# Patient Record
Sex: Female | Born: 1985 | Race: White | Hispanic: No | Smoking: Never smoker
Health system: Southern US, Community
[De-identification: ages and names within clinical notes are randomized; demographics above are authoritative.]

## PROBLEM LIST (undated history)

## (undated) ENCOUNTER — Inpatient Hospital Stay: Payer: Self-pay

## (undated) DIAGNOSIS — Z98891 History of uterine scar from previous surgery: Secondary | ICD-10-CM

## (undated) DIAGNOSIS — E669 Obesity, unspecified: Secondary | ICD-10-CM

## (undated) DIAGNOSIS — Z8759 Personal history of other complications of pregnancy, childbirth and the puerperium: Secondary | ICD-10-CM

## (undated) HISTORY — DX: History of uterine scar from previous surgery: Z98.891

## (undated) HISTORY — DX: Personal history of other complications of pregnancy, childbirth and the puerperium: Z87.59

## (undated) HISTORY — PX: TUBAL LIGATION: SHX77

## (undated) HISTORY — DX: Obesity, unspecified: E66.9

---

## 2015-02-15 ENCOUNTER — Other Ambulatory Visit: Payer: Self-pay | Admitting: Obstetrics and Gynecology

## 2015-02-15 DIAGNOSIS — Z3493 Encounter for supervision of normal pregnancy, unspecified, third trimester: Secondary | ICD-10-CM | POA: Insufficient documentation

## 2015-02-15 DIAGNOSIS — IMO0002 Reserved for concepts with insufficient information to code with codable children: Secondary | ICD-10-CM | POA: Insufficient documentation

## 2015-02-15 DIAGNOSIS — Z3491 Encounter for supervision of normal pregnancy, unspecified, first trimester: Secondary | ICD-10-CM

## 2015-02-15 LAB — HM PAP SMEAR: HM PAP: NEGATIVE

## 2015-03-07 ENCOUNTER — Ambulatory Visit: Payer: Medicaid Other

## 2015-03-07 ENCOUNTER — Ambulatory Visit: Payer: Self-pay

## 2015-03-14 ENCOUNTER — Ambulatory Visit
Admission: RE | Admit: 2015-03-14 | Discharge: 2015-03-14 | Disposition: A | Discharge status: Home / Self Care | Payer: Medicaid Other | Source: Ambulatory Visit | Attending: Maternal & Fetal Medicine | Admitting: Maternal & Fetal Medicine

## 2015-03-14 ENCOUNTER — Ambulatory Visit (HOSPITAL_BASED_OUTPATIENT_CLINIC_OR_DEPARTMENT_OTHER)
Admission: RE | Admit: 2015-03-14 | Discharge: 2015-03-14 | Disposition: A | Discharge status: Home / Self Care | Payer: Medicaid Other | Source: Ambulatory Visit | Attending: Obstetrics and Gynecology | Admitting: Obstetrics and Gynecology

## 2015-03-14 DIAGNOSIS — E669 Obesity, unspecified: Secondary | ICD-10-CM

## 2015-03-14 DIAGNOSIS — O99211 Obesity complicating pregnancy, first trimester: Secondary | ICD-10-CM | POA: Insufficient documentation

## 2015-03-14 DIAGNOSIS — Z3A10 10 weeks gestation of pregnancy: Secondary | ICD-10-CM | POA: Diagnosis not present

## 2015-03-14 DIAGNOSIS — Z36 Encounter for antenatal screening of mother: Secondary | ICD-10-CM | POA: Diagnosis not present

## 2015-03-14 DIAGNOSIS — Z3491 Encounter for supervision of normal pregnancy, unspecified, first trimester: Secondary | ICD-10-CM

## 2015-03-14 DIAGNOSIS — Z369 Encounter for antenatal screening, unspecified: Secondary | ICD-10-CM

## 2015-03-14 NOTE — Progress Notes (Signed)
Agree with assessment and plan by [facility] wells as outlined

## 2015-03-14 NOTE — Progress Notes (Signed)
Pt reviewed with [facility], agree with assessment and plan as outlined below.

## 2015-03-14 NOTE — Progress Notes (Signed)
Referring physician:  [facility] Ob/Gyn Length of Consultation: 40 minutes   [name]  was referred to [facility] for genetic counseling to review prenatal screening and testing options.  This note summarizes the information we discussed.    First trimester screening is a test which includes nuchal translucency ultrasound screen and first trimester maternal serum marker screening.  The nuchal translucency has approximately an 80% detection rate for Down syndrome and can be positive for other chromosome abnormalities as well as congenital heart defects.  When combined with a maternal serum marker screening, the detection rate is up to 90% for Down syndrome and up to 95% for trisomy 18 and 13.     Maternal serum marker screening, a blood test that measures pregnancy proteins, can provide risk assessments for Down syndrome, trisomy 18, and open neural tube defects (spina bifida, anencephaly). Because it does not directly examine the fetus, it cannot positively diagnose or rule out these problems.  Targeted ultrasound uses high frequency sound waves to create an image of the developing fetus.  An ultrasound is often recommended as a routine means of evaluating the pregnancy.  It is also used to screen for fetal anatomy problems (for example, a heart defect) that might be suggestive of a chromosomal or other abnormality.   Should these screening tests indicate an increased concern, then the following diagnostic options would be offered:  The chorionic villus sampling procedure is available for first trimester chromosome analysis.  This involves the withdrawal of a small amount of chorionic villi (tissue from the developing placenta).  Risk of pregnancy loss is estimated to be approximately 1 in 200 to 1 in 100 (0.5 to 1%).  There is approximately a 1% (1 in 100) chance that the CVS chromosome results will be unclear.  Chorionic villi cannot be tested for neural tube defects.      Amniocentesis involves the removal of a small amount of amniotic fluid from the sac surrounding the fetus with the use of a thin needle inserted through the maternal abdomen and uterus.  Ultrasound guidance is used throughout the procedure.  Fetal cells from amniotic fluid are directly evaluated and > 99.5% of chromosome problems and > 98% of open neural tube defects can be detected. This procedure is generally performed after the 15th week of pregnancy.  The main risks to this procedure include complications leading to miscarriage in less than 1 in 200 cases (0.5%).  As another option for information if the pregnancy is suspected to be an an increased chance for certain chromosome conditions, we also reviewed the availability of cell free fetal DNA testing from maternal blood to determine whether or not the baby may have either Down syndrome, trisomy 68, or trisomy 63.  This test utilizes a maternal blood sample and DNA sequencing technology to isolate circulating cell free fetal DNA from maternal plasma.  The fetal DNA can then be analyzed for DNA sequences that are derived from the three most common chromosomes involved in aneuploidy, chromosomes 13, 18, and 21.  If the overall amount of DNA is greater than the expected level for any of these chromosomes, aneuploidy is suspected.  While we do not consider it a replacement for invasive testing and karyotype analysis, a negative result from this testing would be reassuring, though not a guarantee of a normal chromosome complement for the baby.  An abnormal result is certainly suggestive of an abnormal chromosome complement, though we would still recommend CVS or amniocentesis  to confirm any findings from this testing.  Cystic Fibrosis screening was also discussed with the patient. Cystic fibrosis (CF) is one of the most common genetic conditions in persons of Caucasian ancestry.  This condition occurs in approximately 1 in 2,500 Caucasian persons and  results in thickened secretions in the lungs, digestive, and reproductive systems.  For a baby to be at risk for having CF, both of the parents must be carriers for this condition.  Approximately 1 in 64 Caucasian persons is a carrier for CF.  Current carrier testing looks for the most common mutations in the gene for CF and can detect approximately 90% of carriers in the Caucasian population.  This means that the carrier screening can greatly reduce, but cannot eliminate, the chance for an individual to have a child with CF.  If an individual is found to be a carrier for CF, then carrier testing would be available for the partner. As part of Ann Howell's newborn screening profile, all babies born in the state of [place] will have a two-tier screening process.  Specimens are first tested to determine the concentration of immunoreactive trypsinogen (IRT).  The top 5% of specimens with the highest IRT values then undergo DNA testing using a panel of over 40 common CF mutations.   We obtained a detailed family history and pregnancy history.  In the family, [name] reported that her mother and sisters have a history of thyroid disorders and mental health conditions.  We reviewed that these conditions may be seen clustering families, but that no specific genetic markers are known at this time.  It is good to be aware of this history for herself and her children.  The remainder of the family history was reported to be unremarkable for birth defects, mental retardation, recurrent pregnancy loss or known chromosome abnormalities.  [name] stated that this is her sixth pregnancy, the fifth with her current partner.  Her first pregnancy was an elective termination.  The next ended in very early miscarriage.  Since then, she has had three full term c-sections resulting in health children.  In the current pregnancy, she reported no complications or exposures that would be expected to increase the risk for  birth defects.  After consideration of the options, [name] elected to proceed with first trimester screening and to decline CF carrier testing.  However, the ultrasound revealed the gestational age to be 10 weeks.  Therefore, she was too early for the nuchal translucency measurement.  She will return to clinic in 2 weeks for first trimester screening.  Fetal anatomy could not be assessed due to early gestational age.  Please refer to the ultrasound report for details of that study.  [name] was encouraged to call with questions or concerns.  We can be contacted at [phone].   Cherly Anderson, MS, CGC

## 2015-03-28 ENCOUNTER — Ambulatory Visit: Payer: Medicaid Other

## 2015-03-28 ENCOUNTER — Ambulatory Visit
Admission: RE | Admit: 2015-03-28 | Discharge: 2015-03-28 | Disposition: A | Discharge status: Home / Self Care | Payer: Medicaid Other | Source: Ambulatory Visit | Attending: Maternal & Fetal Medicine | Admitting: Maternal & Fetal Medicine

## 2015-03-28 VITALS — BP 129/76 | HR 96 | Temp 98.5°F | Resp 18 | Ht 62.4 in | Wt 183.6 lb

## 2015-03-28 DIAGNOSIS — O99211 Obesity complicating pregnancy, first trimester: Secondary | ICD-10-CM | POA: Diagnosis not present

## 2015-03-28 DIAGNOSIS — Z369 Encounter for antenatal screening, unspecified: Secondary | ICD-10-CM

## 2015-03-28 DIAGNOSIS — E669 Obesity, unspecified: Secondary | ICD-10-CM

## 2015-03-28 DIAGNOSIS — Z3A12 12 weeks gestation of pregnancy: Secondary | ICD-10-CM | POA: Diagnosis not present

## 2015-04-01 ENCOUNTER — Telehealth: Payer: Self-pay | Admitting: Obstetrics and Gynecology

## 2015-04-01 NOTE — Telephone Encounter (Signed)
  Ms. Larna DaughtersBolen elected to undergo First Trimester screening on 03/28/2015.  To review, first trimester screening, includes nuchal translucency ultrasound screen and/or first trimester maternal serum marker screening.  The nuchal translucency has approximately an 80% detection rate for Down syndrome and can be positive for other chromosome abnormalities as well as heart defects.  When combined with a maternal serum marker screening, the detection rate is up to 90% for Down syndrome and up to 97% for trisomy 13 and 18.     The results of the First Trimester Nuchal Translucency and Biochemical Screening were within normal range.  The risk for Down syndrome is now estimated to be less than 1 in 10,000.  The risk for Trisomy 13/18 is also estimated to be less than 1 in 10,000.  Should more definitive information be desired, we would offer amniocentesis.  Because we do not yet know the effectiveness of combined first and second trimester screening, we do not recommend a maternal serum screen to assess the chance for chromosome conditions.  However, if screening for neural tube defects is desired, maternal serum screening for AFP only can be performed between 15 and [redacted] weeks gestation.    Cherly Andersoneborah F. Ibtisam Benge, MS, CGC

## 2015-04-02 ENCOUNTER — Other Ambulatory Visit: Payer: Self-pay | Admitting: Maternal & Fetal Medicine

## 2015-06-09 NOTE — L&D Delivery Note (Signed)
Delivery Summary for Ann Howell  Labor Events:   Preterm labor:   Rupture date:   Rupture time:   Rupture type: Intact  Fluid Color:   Induction:   Augmentation:   Complications:   Cervical ripening:          Delivery:   Episiotomy:   Lacerations:   Repair suture:   Repair # of packets:   Blood loss (ml):    Information for the patient's newborn:  [name], Girl [name] [[id number]][[id number]]    Delivery 09/30/2015 8:15 AM by  C-Section, Low Transverse Sex:  female Gestational Age: 5558w2d Delivery Clinician:  Hildred LaserAnika Bryce Cheever Living?: Yes        APGARS  One minute Five minutes Ten minutes  Skin color: 0   1      Heart rate: 2   2      Grimace: 2   2      Muscle tone: 2   2      Breathing: 2   2      Totals: 8  9      Presentation/position:      Resuscitation: None  Cord information:    Disposition of cord blood:     Blood gases sent?  Complications:   Placenta: Delivered: 09/30/2015 8:16 AM  Manual removal  Intact appearance Newborn Measurements: Weight: 7 lb 9.3 oz (3440 g)  Height: 19.88"  Head circumference:    Chest circumference:    Other providers: Delivery Assist Transition RN Neonatal Nurse Practitioner Daphine DeutscherMartin A Defrancesco Doristine BosworthPamela K Willis Peggy A Mccracken  Additional  information: Forceps:   Vacuum:   Breech:   Observed anomalies        See Dr. Oretha Milchherry's operative note for details of C-section procedure.   Hildred LaserAnika Shondra Capps, MD [facility]

## 2015-07-31 ENCOUNTER — Encounter: Payer: Self-pay | Admitting: Obstetrics and Gynecology

## 2015-07-31 ENCOUNTER — Ambulatory Visit (INDEPENDENT_AMBULATORY_CARE_PROVIDER_SITE_OTHER): Payer: Medicaid Other | Admitting: Obstetrics and Gynecology

## 2015-07-31 VITALS — BP 111/76 | HR 106 | Wt 197.3 lb

## 2015-07-31 DIAGNOSIS — Z98891 History of uterine scar from previous surgery: Secondary | ICD-10-CM

## 2015-07-31 DIAGNOSIS — Z331 Pregnant state, incidental: Secondary | ICD-10-CM

## 2015-07-31 DIAGNOSIS — Z3490 Encounter for supervision of normal pregnancy, unspecified, unspecified trimester: Secondary | ICD-10-CM

## 2015-07-31 DIAGNOSIS — Z36 Encounter for antenatal screening of mother: Secondary | ICD-10-CM

## 2015-07-31 DIAGNOSIS — Z369 Encounter for antenatal screening, unspecified: Secondary | ICD-10-CM

## 2015-07-31 DIAGNOSIS — Z1389 Encounter for screening for other disorder: Secondary | ICD-10-CM

## 2015-07-31 LAB — POCT URINALYSIS DIPSTICK
Bilirubin, UA: NEGATIVE
Blood, UA: NEGATIVE
Glucose, UA: NEGATIVE
Ketones, UA: NEGATIVE
LEUKOCYTES UA: NEGATIVE
NITRITE UA: NEGATIVE
PH UA: 6
PROTEIN UA: NEGATIVE
Spec Grav, UA: 1.015
Urobilinogen, UA: NEGATIVE

## 2015-07-31 NOTE — Progress Notes (Signed)
OB TRANSFER: 30 year old white married female gravida 6 para 3023, (U/S) EDC 4/29 2017 x 10.5 week ultrasound, EGA 30.[redacted] weeks gestation, presents for transfer of care from [facility] OB[facility].  Prenatal course is complicated by history of cesarean section 3; first C-section was for failure to progress; second and third C-sections were repeat C-sections. Prior pregnancies were complicated by gestational hypertension without diagnosis of preeclampsia. Prenatal course to date has been uncomplicated. IMPRESSION: 1. 30.4 week intrauterine pregnancy 2. Prior cesarean section 3 3. History of gestational hypertension in prior pregnancies 4. No complications in this pregnancy PLAN: 1. Routine prenatal care 2. Repeat cesarean section delivery at 39 weeks Herold Harms, MD

## 2015-08-14 ENCOUNTER — Ambulatory Visit (INDEPENDENT_AMBULATORY_CARE_PROVIDER_SITE_OTHER): Payer: Medicaid Other | Admitting: Obstetrics and Gynecology

## 2015-08-14 VITALS — BP 104/67 | HR 111 | Wt 200.8 lb

## 2015-08-14 DIAGNOSIS — E668 Other obesity: Secondary | ICD-10-CM

## 2015-08-14 DIAGNOSIS — Z98891 History of uterine scar from previous surgery: Secondary | ICD-10-CM

## 2015-08-14 DIAGNOSIS — Z3483 Encounter for supervision of other normal pregnancy, third trimester: Secondary | ICD-10-CM

## 2015-08-14 DIAGNOSIS — Z3493 Encounter for supervision of normal pregnancy, unspecified, third trimester: Secondary | ICD-10-CM

## 2015-08-14 DIAGNOSIS — IMO0002 Reserved for concepts with insufficient information to code with codable children: Secondary | ICD-10-CM

## 2015-08-14 LAB — POCT URINALYSIS DIPSTICK
Bilirubin, UA: NEGATIVE
GLUCOSE UA: NEGATIVE
Ketones, UA: NEGATIVE
Leukocytes, UA: NEGATIVE
NITRITE UA: NEGATIVE
Protein, UA: NEGATIVE
Spec Grav, UA: 1.015
UROBILINOGEN UA: NEGATIVE
pH, UA: 7

## 2015-08-14 NOTE — Patient Instructions (Signed)
Postpartum Tubal Ligation Postpartum tubal ligation (PPTL) is a procedure that closes the fallopian tubes right after childbirth or 1-2 days after childbirth. PPTL is done before the uterus returns to its normal location. The procedure is also called a mini-laparotomy. When the fallopian tubes are closed, the eggs that are released from the ovaries cannot enter the uterus, and sperm cannot reach the egg. PPTL is done so you will not be able to get pregnant or have a baby. Although this procedure may be undone (reversed), it should be considered permanent and irreversible. If you want to have future pregnancies, you should not have this procedure. LET YOUR HEALTH CARE PROVIDER KNOW ABOUT:  Any allergies you have.  All medicines you are taking, including vitamins, herbs, eye drops, creams, and over-the-counter medicines. This includes any use of steroids, either by mouth or in cream form.  Previous problems you or members of your family have had with the use of anesthetics.  Any blood disorders you have.  Previous surgeries you have had.  Any medical conditions you may have. RISKS AND COMPLICATIONS  Infection.  Bleeding.  Injury to surrounding organs.  Side effects from anesthetics.  Failure of the procedure.  Ectopic pregnancy.  Future regret about having the procedure done. BEFORE THE PROCEDURE  You may need to sign certain documents, including an informed consent form, up to 30 days before the date of your tubal ligation.  Follow instructions from your health care provider about eating and drinking restrictions. PROCEDURE  If done 1-2 days after a vaginal delivery:  You will be given one or more of the following:  A medicine that helps you relax (sedative).  A medicine that numbs the area (local anesthetic).  A medicine that makes you fall asleep (general anesthetic).  A medicine that is injected into an area of your body that numbs everything below the injection site  (regional anesthetic).  If you have been given general anesthetic, a tube will be put down your throat to help you breathe.  Your bladder may be emptied with a small tube (catheter).  A small cut (incision) will be made just above the pubic hair line.  The fallopian tubes will be located and brought up through the incision.  The fallopian tubes will be tied off or burned (cauterized), or they will be closed with a clamp, ring, or clip. In many cases, a small portion in the center of each fallopian tube will also be removed.  The incision will be closed with stitches (sutures).  A bandage (dressing) will be placed over the incision. The procedure may vary among health care providers and hospitals. If done after a cesarean delivery:  Tubal ligation will be done through the incision that was used for the cesarean delivery of your baby.  After the tubes are closed, the incision will be closed with stitches (sutures).  A bandage (dressing) will be placed over the incision. The procedure may vary among health care providers and hospitals. AFTER THE PROCEDURE  Your blood pressure, heart rate, breathing rate, and blood oxygen level will be monitored often until the medicines you were given have worn off.  You will be given pain medicine as needed.  If you had general anesthetic, you may have some mild discomfort in your throat. This is from the breathing tube that was placed in your throat while you were sleeping.  You may feel tired, and you should rest for the remainder of the day.  You may have some pain   or cramps in the abdominal area for 3-7 days.   This information is not intended to replace advice given to you by your health care provider. Make sure you discuss any questions you have with your health care provider.   Document Released: 05/25/2005 Document Revised: 10/09/2014 Document Reviewed: 09/05/2011 Elsevier Interactive Patient Education 2016 Elsevier Inc.  

## 2015-08-14 NOTE — Progress Notes (Signed)
ROB: Patient denies major complaints.  Encouraged patient to take PNV (not currently taking). Discussion had on need for repeat C-section (prior C-section x 3). To schedule at 39 weeks.  Contraception discussed.  Desires BTL at time of C-section. Other reversible forms of contraception were discussed with patient; she declines all other modalities. Risks of procedure discussed with patient including but not limited to: risk of regret, permanence of method, bleeding, infection, injury to surrounding organs and need for additional procedures.  Failure risk of 1-2 % with increased risk of ectopic gestation if pregnancy occurs was also discussed with patient.  Patient verbalized understanding of these risks and wants to proceed with sterilization.  Signed papers today.  To be scheduled for 09/30/2015.  Discussed PTL, fetal kick counts. RTC in 2 weeks.

## 2015-08-19 ENCOUNTER — Encounter: Payer: Self-pay | Admitting: Obstetrics and Gynecology

## 2015-08-21 ENCOUNTER — Encounter: Payer: Self-pay | Admitting: *Deleted

## 2015-08-21 ENCOUNTER — Observation Stay
Admission: EM | Admit: 2015-08-21 | Discharge: 2015-08-21 | Disposition: A | Discharge status: Home / Self Care | Payer: Medicaid Other | Attending: Obstetrics and Gynecology | Admitting: Obstetrics and Gynecology

## 2015-08-21 DIAGNOSIS — Z3A33 33 weeks gestation of pregnancy: Secondary | ICD-10-CM | POA: Diagnosis not present

## 2015-08-21 DIAGNOSIS — Z369 Encounter for antenatal screening, unspecified: Secondary | ICD-10-CM

## 2015-08-21 DIAGNOSIS — O4700 False labor before 37 completed weeks of gestation, unspecified trimester: Secondary | ICD-10-CM | POA: Diagnosis present

## 2015-08-21 DIAGNOSIS — O479 False labor, unspecified: Secondary | ICD-10-CM | POA: Diagnosis present

## 2015-08-21 LAB — URINALYSIS COMPLETE WITH MICROSCOPIC (ARMC ONLY)
Bilirubin Urine: NEGATIVE
Glucose, UA: NEGATIVE mg/dL
Hgb urine dipstick: NEGATIVE
KETONES UR: NEGATIVE mg/dL
Leukocytes, UA: NEGATIVE
NITRITE: NEGATIVE
PH: 6 (ref 5.0–8.0)
PROTEIN: NEGATIVE mg/dL
SPECIFIC GRAVITY, URINE: 1.017 (ref 1.005–1.030)

## 2015-08-21 LAB — FETAL FIBRONECTIN: FETAL FIBRONECTIN: NEGATIVE

## 2015-08-21 NOTE — Discharge Instructions (Signed)
Labor Precautions reviewed with patient:  Please call your doctor if you have temp. Equal to or greater than 100.4; spontaneous rupture of membrane, decrease fetal movement, vaginal bleeding, contracting on a regular basis inspite of adequate intake of water (64 oz per day) and adequate rest. And contractions are 5-10 minutes apart, each contraction lasting 60 sec. And this has been going on for one hour.  Pt. Verbalized understanding of discharge orders. Copy signed by patient, copy given.

## 2015-08-21 NOTE — OB Triage Note (Addendum)
Contractions started on Monday, March 13th, one hour to 30 minutes apart. Contractions getting stronger, with sharp pains.  Patient placed on EFM; FHR 130s with positive fetal movement, contractions palpating mild with adequate relaxation . Pt. With no complaints of vaginal bleeding nor discharge.

## 2015-08-21 NOTE — Progress Notes (Signed)
Pt. Complaint  of SOB in  low lying position, HOB increased to HF position,  "that feels must better".  Denies SOB at this time.  FFN via vaginal swab and US collected and sent to lab. SVE - closed.

## 2015-08-21 NOTE — Progress Notes (Addendum)
PO fluids given (32 oz water), for PO hydration. Pt. declined Tylenol for discomfort.  Spouse at the bedside.

## 2015-08-26 NOTE — Final Progress Note (Signed)
.  L&D OB Triage Note  [name] is a 30 y.o. [id number] female at 5013w5d, EDD Estimated Date of Delivery: 10/05/15 who presented to triage for complaints of contractions, ~ 30-60 min apart, getting stronger.  She was evaluated by the nurses with no significant findings for preterm labor. Cervix closed. FFN negative. Fetal monitoring noting contractions q 10-15 min. Vital signs stable. An NST was performed and has been reviewed by MD. She was treated with PO hydration, declined Tylenol that was offered, contractions spaced significantly.   NST INTERPRETATION: Indications: rule out uterine contractions  Mode: External Baseline Rate (A): 120 bpm Variability: Moderate Accelerations: 15 x 15 Decelerations: None     Contraction Frequency (min): x1  Impression: reactive   Plan: NST performed was reviewed and was found to be reactive. She was discharged home with bleeding/labor precautions.  Continue routine prenatal care. Follow up with OB/GYN as previously scheduled.     Hildred LaserAnika Kristapher [place], MD [facility]

## 2015-08-29 ENCOUNTER — Ambulatory Visit (INDEPENDENT_AMBULATORY_CARE_PROVIDER_SITE_OTHER): Payer: Medicaid Other | Admitting: Obstetrics and Gynecology

## 2015-08-29 VITALS — BP 103/68 | HR 109 | Wt 203.7 lb

## 2015-08-29 DIAGNOSIS — IMO0002 Reserved for concepts with insufficient information to code with codable children: Secondary | ICD-10-CM

## 2015-08-29 DIAGNOSIS — Z98891 History of uterine scar from previous surgery: Secondary | ICD-10-CM

## 2015-08-29 DIAGNOSIS — E668 Other obesity: Secondary | ICD-10-CM

## 2015-08-29 DIAGNOSIS — Z3483 Encounter for supervision of other normal pregnancy, third trimester: Secondary | ICD-10-CM

## 2015-08-29 DIAGNOSIS — Z3493 Encounter for supervision of normal pregnancy, unspecified, third trimester: Secondary | ICD-10-CM

## 2015-08-29 LAB — POCT URINALYSIS DIPSTICK
BILIRUBIN UA: NEGATIVE
GLUCOSE UA: NEGATIVE
KETONES UA: NEGATIVE
Leukocytes, UA: NEGATIVE
Nitrite, UA: NEGATIVE
Protein, UA: NEGATIVE
SPEC GRAV UA: 1.01
Urobilinogen, UA: NEGATIVE
pH, UA: 7.5

## 2015-08-29 NOTE — Progress Notes (Signed)
ROB: Patient denies complaints.  RTC in 2 weeks. For 36 week labs at that time.

## 2015-09-05 ENCOUNTER — Ambulatory Visit (INDEPENDENT_AMBULATORY_CARE_PROVIDER_SITE_OTHER): Payer: Medicaid Other | Admitting: Obstetrics and Gynecology

## 2015-09-05 VITALS — BP 118/73 | HR 118 | Wt 205.4 lb

## 2015-09-05 DIAGNOSIS — Z98891 History of uterine scar from previous surgery: Secondary | ICD-10-CM

## 2015-09-05 DIAGNOSIS — Z3483 Encounter for supervision of other normal pregnancy, third trimester: Secondary | ICD-10-CM

## 2015-09-05 DIAGNOSIS — Z36 Encounter for antenatal screening of mother: Secondary | ICD-10-CM

## 2015-09-05 DIAGNOSIS — O99210 Obesity complicating pregnancy, unspecified trimester: Secondary | ICD-10-CM

## 2015-09-05 DIAGNOSIS — Z369 Encounter for antenatal screening, unspecified: Secondary | ICD-10-CM

## 2015-09-05 DIAGNOSIS — Z113 Encounter for screening for infections with a predominantly sexual mode of transmission: Secondary | ICD-10-CM

## 2015-09-05 DIAGNOSIS — Z3493 Encounter for supervision of normal pregnancy, unspecified, third trimester: Secondary | ICD-10-CM

## 2015-09-05 LAB — POCT URINALYSIS DIPSTICK
BILIRUBIN UA: NEGATIVE
GLUCOSE UA: NEGATIVE
KETONES UA: NEGATIVE
Nitrite, UA: NEGATIVE
PH UA: 7.5
RBC UA: NEGATIVE
SPEC GRAV UA: 1.01
Urobilinogen, UA: NEGATIVE

## 2015-09-05 NOTE — Progress Notes (Signed)
ROB: Patient notes 1 episode of sharp head pain/headache in frontal/parietal region , lasting for ~ 10 secs.  Notes that she had to almost pull over while driving.  Denies h/o headaches, migraines, or eye pain.  Advised to infomr MD if episodes continue.  Maternal tachycardia noted, asymptomatic.

## 2015-09-07 LAB — GC/CHLAMYDIA PROBE AMP
Chlamydia trachomatis, NAA: NEGATIVE
Neisseria gonorrhoeae by PCR: NEGATIVE

## 2015-09-09 LAB — CULTURE, BETA STREP (GROUP B ONLY): STREP GP B CULTURE: NEGATIVE

## 2015-09-10 ENCOUNTER — Ambulatory Visit (INDEPENDENT_AMBULATORY_CARE_PROVIDER_SITE_OTHER): Payer: Medicaid Other

## 2015-09-10 DIAGNOSIS — Z3483 Encounter for supervision of other normal pregnancy, third trimester: Secondary | ICD-10-CM

## 2015-09-10 DIAGNOSIS — O99210 Obesity complicating pregnancy, unspecified trimester: Secondary | ICD-10-CM

## 2015-09-10 DIAGNOSIS — Z3493 Encounter for supervision of normal pregnancy, unspecified, third trimester: Secondary | ICD-10-CM

## 2015-09-11 ENCOUNTER — Encounter: Payer: Self-pay | Admitting: Obstetrics and Gynecology

## 2015-09-11 ENCOUNTER — Ambulatory Visit (INDEPENDENT_AMBULATORY_CARE_PROVIDER_SITE_OTHER): Payer: Medicaid Other | Admitting: Obstetrics and Gynecology

## 2015-09-11 VITALS — BP 112/72 | HR 101 | Wt 208.8 lb

## 2015-09-11 DIAGNOSIS — IMO0002 Reserved for concepts with insufficient information to code with codable children: Secondary | ICD-10-CM

## 2015-09-11 DIAGNOSIS — Z3493 Encounter for supervision of normal pregnancy, unspecified, third trimester: Secondary | ICD-10-CM

## 2015-09-11 DIAGNOSIS — E668 Other obesity: Secondary | ICD-10-CM

## 2015-09-11 DIAGNOSIS — Z98891 History of uterine scar from previous surgery: Secondary | ICD-10-CM

## 2015-09-11 DIAGNOSIS — Z3483 Encounter for supervision of other normal pregnancy, third trimester: Secondary | ICD-10-CM

## 2015-09-11 LAB — POCT URINALYSIS DIPSTICK
Bilirubin, UA: NEGATIVE
Glucose, UA: NEGATIVE
Ketones, UA: NEGATIVE
LEUKOCYTES UA: NEGATIVE
NITRITE UA: NEGATIVE
PH UA: 6.5
PROTEIN UA: NEGATIVE
Spec Grav, UA: 1.015
UROBILINOGEN UA: NEGATIVE

## 2015-09-11 NOTE — Progress Notes (Signed)
ROB: Patient doing well, no complaints.  S/p growth scan yesterday with normal growth (42%ile), and normal AFI (~16 cm).  36 week labs negative. Labor precautions given.  RTC in 1 week.

## 2015-09-15 ENCOUNTER — Observation Stay
Admission: EM | Admit: 2015-09-15 | Discharge: 2015-09-15 | Disposition: A | Discharge status: Home / Self Care | Payer: Medicaid Other | Attending: Obstetrics and Gynecology | Admitting: Obstetrics and Gynecology

## 2015-09-15 DIAGNOSIS — O471 False labor at or after 37 completed weeks of gestation: Secondary | ICD-10-CM | POA: Diagnosis present

## 2015-09-15 DIAGNOSIS — Z3A37 37 weeks gestation of pregnancy: Secondary | ICD-10-CM | POA: Diagnosis not present

## 2015-09-15 DIAGNOSIS — Z3490 Encounter for supervision of normal pregnancy, unspecified, unspecified trimester: Secondary | ICD-10-CM

## 2015-09-15 DIAGNOSIS — O34219 Maternal care for unspecified type scar from previous cesarean delivery: Secondary | ICD-10-CM | POA: Insufficient documentation

## 2015-09-15 NOTE — OB Triage Note (Signed)
Patient presents with complaints of contractions and lower back pain since 4pm. States she has been very active today.  Pain and contractions resisted at home after bath and rest.  Denies any vaginal bleeding or spotting.  Denies s/s srom.  Patient has had 3 previous c/sections.  Last ate banana and fluids 9pm.  efm and toco applied.  fhr-130.

## 2015-09-16 NOTE — Final Progress Note (Signed)
L&D OB Triage Note  SUBJECTIVE Ann Howell is a 30 y.o. [id number] female at 6568w2d, EDD Estimated Date of Delivery: 10/05/15 who presented to triage with complaints of contractions.  OB history notable for C/S x 3.  OBJECTIVE Nursing Evaluation: BP 122/70 mmHg  Pulse 104  Temp(Src) 97.9 F (36.6 C) (Oral)  Resp 20  LMP 12/08/2014 (Approximate) no significant findings for labor  NST was performed and has been reviewed by me.  NST INTERPRETATION: Indications: Contractions; H/O C/S x 3  Mode: External Baseline Rate (A): 130 bpm Variability: Moderate Accelerations: 15 x 15 Decelerations: None     Contraction Frequency (min): rare  ASSESSMENT Impression:  1. Pregnancy:  [id number] at 2868w2d , EDD Estimated Date of Delivery: 10/05/15 2.  Reactive NST 3. History of C/S x 3   PLAN 1. Reassurance given 2. Discharge home with bleeding/labor precautions.  3. Continue routine prenatal care.   Herold HarmsMartin A Zyrell Carmean, MD

## 2015-09-18 ENCOUNTER — Ambulatory Visit (INDEPENDENT_AMBULATORY_CARE_PROVIDER_SITE_OTHER): Payer: Medicaid Other | Admitting: Obstetrics and Gynecology

## 2015-09-18 VITALS — BP 103/67 | HR 108 | Wt 208.0 lb

## 2015-09-18 DIAGNOSIS — Z3483 Encounter for supervision of other normal pregnancy, third trimester: Secondary | ICD-10-CM

## 2015-09-18 DIAGNOSIS — E668 Other obesity: Secondary | ICD-10-CM

## 2015-09-18 DIAGNOSIS — Z98891 History of uterine scar from previous surgery: Secondary | ICD-10-CM

## 2015-09-18 DIAGNOSIS — IMO0002 Reserved for concepts with insufficient information to code with codable children: Secondary | ICD-10-CM

## 2015-09-18 DIAGNOSIS — Z3493 Encounter for supervision of normal pregnancy, unspecified, third trimester: Secondary | ICD-10-CM

## 2015-09-18 LAB — POCT URINALYSIS DIPSTICK
Bilirubin, UA: NEGATIVE
GLUCOSE UA: NEGATIVE
KETONES UA: NEGATIVE
LEUKOCYTES UA: NEGATIVE
Nitrite, UA: NEGATIVE
PROTEIN UA: NEGATIVE
SPEC GRAV UA: 1.015
Urobilinogen, UA: NEGATIVE
pH, UA: 6.5

## 2015-09-18 NOTE — Progress Notes (Signed)
ROB: Seen in OB triage over the weekend for contractions, ruled out for labor, treated with fluids.  Denies complaints today.  Labor precautions given.  RTC in 1 week.

## 2015-09-25 ENCOUNTER — Ambulatory Visit (INDEPENDENT_AMBULATORY_CARE_PROVIDER_SITE_OTHER): Payer: Medicaid Other | Admitting: Obstetrics and Gynecology

## 2015-09-25 VITALS — BP 120/75 | HR 96 | Wt 207.7 lb

## 2015-09-25 DIAGNOSIS — E668 Other obesity: Secondary | ICD-10-CM

## 2015-09-25 DIAGNOSIS — IMO0002 Reserved for concepts with insufficient information to code with codable children: Secondary | ICD-10-CM

## 2015-09-25 DIAGNOSIS — Z3483 Encounter for supervision of other normal pregnancy, third trimester: Secondary | ICD-10-CM

## 2015-09-25 DIAGNOSIS — Z3493 Encounter for supervision of normal pregnancy, unspecified, third trimester: Secondary | ICD-10-CM

## 2015-09-25 DIAGNOSIS — Z98891 History of uterine scar from previous surgery: Secondary | ICD-10-CM

## 2015-09-25 LAB — POCT URINALYSIS DIPSTICK
BILIRUBIN UA: NEGATIVE
Glucose, UA: NEGATIVE
Ketones, UA: NEGATIVE
NITRITE UA: NEGATIVE
PH UA: 7
Protein, UA: NEGATIVE
Spec Grav, UA: 1.02
Urobilinogen, UA: NEGATIVE

## 2015-09-25 NOTE — Progress Notes (Signed)
ROB: Denies complaints.  All questions answered regarding scheduled C-section. Pre-op done today.  Scheduled for repeat C-section with BTL on 09/30/2015.

## 2015-09-27 ENCOUNTER — Encounter
Admission: RE | Admit: 2015-09-27 | Discharge: 2015-09-27 | Disposition: A | Discharge status: Home / Self Care | Payer: Medicaid Other | Source: Ambulatory Visit | Attending: Obstetrics and Gynecology | Admitting: Obstetrics and Gynecology

## 2015-09-27 DIAGNOSIS — Z01812 Encounter for preprocedural laboratory examination: Secondary | ICD-10-CM | POA: Diagnosis not present

## 2015-09-27 LAB — CBC
HCT: 32.5 % — ABNORMAL LOW (ref 35.0–47.0)
Hemoglobin: 11 g/dL — ABNORMAL LOW (ref 12.0–16.0)
MCH: 29.5 pg (ref 26.0–34.0)
MCHC: 33.7 g/dL (ref 32.0–36.0)
MCV: 87.4 fL (ref 80.0–100.0)
PLATELETS: 217 10*3/uL (ref 150–440)
RBC: 3.72 MIL/uL — AB (ref 3.80–5.20)
RDW: 14.8 % — AB (ref 11.5–14.5)
WBC: 12.6 10*3/uL — ABNORMAL HIGH (ref 3.6–11.0)

## 2015-09-27 LAB — RAPID HIV SCREEN (HIV 1/2 AB+AG)
HIV 1/2 Antibodies: NONREACTIVE
HIV-1 P24 ANTIGEN - HIV24: NONREACTIVE

## 2015-09-27 LAB — TYPE AND SCREEN
ABO/RH(D): B POS
Antibody Screen: NEGATIVE
Extend sample reason: UNDETERMINED

## 2015-09-27 LAB — ABO/RH: ABO/RH(D): B POS

## 2015-09-27 NOTE — Patient Instructions (Signed)
  Your procedure is scheduled on: September 30, 2015 (Monday) Report to EMERGENCY DEPARTMENT To find out your arrival time please call [phone] between 1PM - 3PM on ARRIVAL TIME 5:30 AM.  Remember: Instructions that are not followed completely may result in serious medical risk, up to and including death, or upon the discretion of your surgeon and anesthesiologist your surgery may need to be rescheduled.    __x__ 1. Do not eat food or drink liquids after midnight. No gum chewing or hard candies.     __x__ 2. No Alcohol for 24 hours before or after surgery.   ____ 3. Bring all medications with you on the day of surgery if instructed.    __x_ 4. Notify your doctor if there is any change in your medical condition     (cold, fever, infections).     Do not wear jewelry, make-up, hairpins, clips or nail polish.  Do not wear lotions, powders, or perfumes. You may wear deodorant.  Do not shave 48 hours prior to surgery. Men may shave face and neck.  Do not bring valuables to the hospital.    [facility] belongings or valuables.               Contacts, dentures or bridgework may not be worn into surgery.  Leave your suitcase in the car. After surgery it may be brought to your room.  For patients admitted to the hospital, discharge time is determined by your                treatment team.   Patients discharged the day of surgery will not be allowed to drive home.   Please read over the following fact sheets that you were given:   Surgical Site Infection Prevention   ____ Take these medicines the morning of surgery with A SIP OF WATER:    1.   2.   3.   4.  5.  6.  ____ Fleet Enema (as directed)   _x___ Use CHG Soap as directed (SAGE WIPES)  ____ Use inhalers on the day of surgery  ____ Stop metformin 2 days prior to surgery    ____ Take 1/2 of usual insulin dose the night before surgery and none on the morning of surgery.   __x__ Stop  Coumadin/Plavix/aspirin on (N/A)  __x__ Stop Anti-inflammatories on (NO NSAIDS)   ____ Stop supplements until after surgery.    ____ Bring C-Pap to the hospital.

## 2015-09-28 LAB — RPR: RPR: NONREACTIVE

## 2015-09-30 ENCOUNTER — Inpatient Hospital Stay
Admission: RE | Admit: 2015-09-30 | Discharge: 2015-10-03 | DRG: 766 | Disposition: A | Discharge status: Home / Self Care | Payer: Medicaid Other | Source: Ambulatory Visit | Attending: Obstetrics and Gynecology | Admitting: Obstetrics and Gynecology

## 2015-09-30 ENCOUNTER — Inpatient Hospital Stay: Payer: Medicaid Other | Admitting: Anesthesiology

## 2015-09-30 ENCOUNTER — Encounter
Admission: RE | Disposition: A | Discharge status: Home / Self Care | Payer: Self-pay | Source: Ambulatory Visit | Attending: Obstetrics and Gynecology

## 2015-09-30 DIAGNOSIS — E6609 Other obesity due to excess calories: Secondary | ICD-10-CM | POA: Diagnosis present

## 2015-09-30 DIAGNOSIS — IMO0002 Reserved for concepts with insufficient information to code with codable children: Secondary | ICD-10-CM

## 2015-09-30 DIAGNOSIS — O34211 Maternal care for low transverse scar from previous cesarean delivery: Secondary | ICD-10-CM | POA: Diagnosis present

## 2015-09-30 DIAGNOSIS — N736 Female pelvic peritoneal adhesions (postinfective): Secondary | ICD-10-CM | POA: Diagnosis present

## 2015-09-30 DIAGNOSIS — Z302 Encounter for sterilization: Secondary | ICD-10-CM | POA: Diagnosis not present

## 2015-09-30 DIAGNOSIS — Z3493 Encounter for supervision of normal pregnancy, unspecified, third trimester: Secondary | ICD-10-CM | POA: Diagnosis not present

## 2015-09-30 DIAGNOSIS — Z6837 Body mass index (BMI) 37.0-37.9, adult: Secondary | ICD-10-CM

## 2015-09-30 DIAGNOSIS — Z98891 History of uterine scar from previous surgery: Secondary | ICD-10-CM

## 2015-09-30 DIAGNOSIS — O9989 Other specified diseases and conditions complicating pregnancy, childbirth and the puerperium: Secondary | ICD-10-CM | POA: Diagnosis present

## 2015-09-30 DIAGNOSIS — O99214 Obesity complicating childbirth: Secondary | ICD-10-CM | POA: Diagnosis present

## 2015-09-30 DIAGNOSIS — Z3A39 39 weeks gestation of pregnancy: Secondary | ICD-10-CM | POA: Diagnosis not present

## 2015-09-30 DIAGNOSIS — Z369 Encounter for antenatal screening, unspecified: Secondary | ICD-10-CM

## 2015-09-30 SURGERY — Surgical Case
Anesthesia: Spinal | Laterality: Bilateral

## 2015-09-30 MED ORDER — OXYCODONE-ACETAMINOPHEN 5-325 MG PO TABS
2.0000 | ORAL_TABLET | ORAL | Status: DC | PRN
  Administered 2015-09-30 – 2015-10-01 (×3): 2 via ORAL
  Filled 2015-09-30 (×4): qty 2

## 2015-09-30 MED ORDER — IBUPROFEN 600 MG PO TABS
600.0000 mg | ORAL_TABLET | Freq: Four times a day (QID) | ORAL | Status: DC | PRN
  Filled 2015-09-30 (×9): qty 1

## 2015-09-30 MED ORDER — DIBUCAINE 1 % RE OINT: 1.0000 "application " | TOPICAL_OINTMENT | RECTAL | Status: DC | PRN

## 2015-09-30 MED ORDER — CITRIC ACID-SODIUM CITRATE 334-500 MG/5ML PO SOLN
ORAL | Status: AC
  Administered 2015-09-30: 30 mL
  Filled 2015-09-30: qty 15

## 2015-09-30 MED ORDER — MORPHINE SULFATE (PF) 2 MG/ML IV SOLN: 2.0000 mg | INTRAVENOUS | Status: DC | PRN

## 2015-09-30 MED ORDER — NALBUPHINE HCL 10 MG/ML IJ SOLN: 5.0000 mg | INTRAMUSCULAR | Status: DC | PRN

## 2015-09-30 MED ORDER — PHENYLEPHRINE HCL 10 MG/ML IJ SOLN
INTRAMUSCULAR | Status: DC | PRN
  Administered 2015-09-30 (×4): 100 ug via INTRAVENOUS

## 2015-09-30 MED ORDER — SODIUM CHLORIDE 0.9% FLUSH
3.0000 mL | INTRAVENOUS | Status: DC | PRN
Start: 2015-09-30 — End: 2015-10-02

## 2015-09-30 MED ORDER — NALOXONE HCL 0.4 MG/ML IJ SOLN: 0.4000 mg | INTRAMUSCULAR | Status: DC | PRN

## 2015-09-30 MED ORDER — MORPHINE SULFATE (PF) 2 MG/ML IV SOLN
1.0000 mg | INTRAVENOUS | Status: DC | PRN
  Administered 2015-09-30: 1 mg via INTRAVENOUS
  Filled 2015-09-30: qty 1

## 2015-09-30 MED ORDER — LACTATED RINGERS IV SOLN
2.5000 [IU]/h | INTRAVENOUS | Status: AC
  Filled 2015-09-30: qty 4

## 2015-09-30 MED ORDER — OXYTOCIN 40 UNITS IN LACTATED RINGERS INFUSION - SIMPLE MED
INTRAVENOUS | Status: AC
  Filled 2015-09-30: qty 1000

## 2015-09-30 MED ORDER — EPHEDRINE SULFATE 50 MG/ML IJ SOLN
INTRAMUSCULAR | Status: DC | PRN
  Administered 2015-09-30: 5 mg via INTRAVENOUS
  Administered 2015-09-30: 10 mg via INTRAVENOUS

## 2015-09-30 MED ORDER — LACTATED RINGERS IV SOLN
INTRAVENOUS | Status: DC
  Administered 2015-09-30 – 2015-10-01 (×3): via INTRAVENOUS

## 2015-09-30 MED ORDER — ONDANSETRON HCL 4 MG/2ML IJ SOLN: 4.0000 mg | Freq: Once | INTRAMUSCULAR | Status: DC | PRN

## 2015-09-30 MED ORDER — FENTANYL CITRATE (PF) 100 MCG/2ML IJ SOLN
25.0000 ug | INTRAMUSCULAR | Status: AC | PRN
  Administered 2015-09-30 (×6): 25 ug via INTRAVENOUS
  Filled 2015-09-30 (×2): qty 2

## 2015-09-30 MED ORDER — CEFAZOLIN SODIUM 1-5 GM-% IV SOLN
INTRAVENOUS | Status: DC | PRN
  Administered 2015-09-30: 2 g via INTRAVENOUS

## 2015-09-30 MED ORDER — PRENATAL MULTIVITAMIN CH
1.0000 | ORAL_TABLET | Freq: Every day | ORAL | Status: DC
  Administered 2015-09-30 – 2015-10-03 (×4): 1 via ORAL
  Filled 2015-09-30 (×4): qty 1

## 2015-09-30 MED ORDER — SIMETHICONE 80 MG PO CHEW
80.0000 mg | CHEWABLE_TABLET | ORAL | Status: DC
  Administered 2015-09-30 – 2015-10-02 (×3): 80 mg via ORAL
  Filled 2015-09-30 (×5): qty 1

## 2015-09-30 MED ORDER — MORPHINE SULFATE (PF) 0.5 MG/ML IJ SOLN
INTRAMUSCULAR | Status: DC | PRN
  Administered 2015-09-30: .2 mg via EPIDURAL

## 2015-09-30 MED ORDER — BUPIVACAINE IN DEXTROSE 0.75-8.25 % IT SOLN
INTRATHECAL | Status: DC | PRN
  Administered 2015-09-30: 1.6 mL via INTRATHECAL

## 2015-09-30 MED ORDER — ONDANSETRON HCL 4 MG/2ML IJ SOLN: 4.0000 mg | Freq: Three times a day (TID) | INTRAMUSCULAR | Status: DC | PRN

## 2015-09-30 MED ORDER — SCOPOLAMINE 1 MG/3DAYS TD PT72
1.0000 | MEDICATED_PATCH | Freq: Once | TRANSDERMAL | Status: AC
  Administered 2015-09-30: 1.5 mg via TRANSDERMAL
  Filled 2015-09-30: qty 1

## 2015-09-30 MED ORDER — ONDANSETRON HCL 4 MG/2ML IJ SOLN
INTRAMUSCULAR | Status: DC | PRN
  Administered 2015-09-30: 4 mg via INTRAVENOUS

## 2015-09-30 MED ORDER — MAGNESIUM HYDROXIDE 400 MG/5ML PO SUSP
30.0000 mL | ORAL | Status: DC | PRN
  Administered 2015-10-01: 30 mL via ORAL
  Filled 2015-09-30: qty 30

## 2015-09-30 MED ORDER — SIMETHICONE 80 MG PO CHEW
80.0000 mg | CHEWABLE_TABLET | ORAL | Status: DC | PRN
  Administered 2015-10-01 – 2015-10-02 (×3): 80 mg via ORAL
  Filled 2015-09-30: qty 1

## 2015-09-30 MED ORDER — CEFAZOLIN SODIUM-DEXTROSE 2-4 GM/100ML-% IV SOLN
2.0000 g | INTRAVENOUS | Status: AC
  Administered 2015-09-30: 2 g via INTRAVENOUS
  Filled 2015-09-30: qty 100

## 2015-09-30 MED ORDER — ACETAMINOPHEN 325 MG PO TABS: 650.0000 mg | ORAL_TABLET | ORAL | Status: DC | PRN

## 2015-09-30 MED ORDER — NALBUPHINE HCL 10 MG/ML IJ SOLN: 5.0000 mg | Freq: Once | INTRAMUSCULAR | Status: DC | PRN

## 2015-09-30 MED ORDER — MENTHOL 3 MG MT LOZG
1.0000 | LOZENGE | OROMUCOSAL | Status: DC | PRN
  Filled 2015-09-30: qty 9

## 2015-09-30 MED ORDER — ACETAMINOPHEN 60 MG HALF SUPP: 10.0000 mg/kg | Freq: Once | RECTAL | Status: DC

## 2015-09-30 MED ORDER — ZOLPIDEM TARTRATE 5 MG PO TABS: 5.0000 mg | ORAL_TABLET | Freq: Every evening | ORAL | Status: DC | PRN

## 2015-09-30 MED ORDER — LIDOCAINE 5 % EX PTCH
1.0000 | MEDICATED_PATCH | CUTANEOUS | Status: AC
  Administered 2015-10-01 – 2015-10-02 (×2): 1 via TRANSDERMAL
  Filled 2015-09-30 (×2): qty 1

## 2015-09-30 MED ORDER — NALOXONE HCL 2 MG/2ML IJ SOSY
1.0000 ug/kg/h | PREFILLED_SYRINGE | INTRAMUSCULAR | Status: DC | PRN
  Filled 2015-09-30: qty 2

## 2015-09-30 MED ORDER — SENNOSIDES-DOCUSATE SODIUM 8.6-50 MG PO TABS
2.0000 | ORAL_TABLET | ORAL | Status: DC
  Administered 2015-09-30 – 2015-10-02 (×3): 2 via ORAL
  Filled 2015-09-30 (×3): qty 2

## 2015-09-30 MED ORDER — OXYCODONE-ACETAMINOPHEN 5-325 MG PO TABS
1.0000 | ORAL_TABLET | ORAL | Status: DC | PRN
  Administered 2015-10-01 – 2015-10-03 (×7): 1 via ORAL
  Filled 2015-09-30 (×6): qty 1

## 2015-09-30 MED ORDER — FERROUS SULFATE 325 (65 FE) MG PO TABS
325.0000 mg | ORAL_TABLET | Freq: Two times a day (BID) | ORAL | Status: DC
  Administered 2015-09-30 – 2015-10-03 (×5): 325 mg via ORAL
  Filled 2015-09-30 (×6): qty 1

## 2015-09-30 MED ORDER — LACTATED RINGERS IV SOLN
INTRAVENOUS | Status: DC
  Administered 2015-09-30 (×2): via INTRAVENOUS

## 2015-09-30 MED ORDER — MEPERIDINE HCL 25 MG/ML IJ SOLN: 6.2500 mg | INTRAMUSCULAR | Status: DC | PRN

## 2015-09-30 MED ORDER — DIPHENHYDRAMINE HCL 25 MG PO CAPS: 25.0000 mg | ORAL_CAPSULE | Freq: Four times a day (QID) | ORAL | Status: DC | PRN

## 2015-09-30 MED ORDER — MIDAZOLAM HCL 2 MG/2ML IJ SOLN
INTRAMUSCULAR | Status: DC | PRN
  Administered 2015-09-30: 2 mg via INTRAVENOUS

## 2015-09-30 MED ORDER — LIDOCAINE 5 % EX PTCH
MEDICATED_PATCH | CUTANEOUS | Status: DC | PRN
  Administered 2015-09-30: 1 via TRANSDERMAL

## 2015-09-30 MED ORDER — LIDOCAINE 5 % EX PTCH
MEDICATED_PATCH | CUTANEOUS | Status: AC
  Filled 2015-09-30: qty 1

## 2015-09-30 MED ORDER — DIPHENHYDRAMINE HCL 25 MG PO CAPS: 25.0000 mg | ORAL_CAPSULE | ORAL | Status: DC | PRN

## 2015-09-30 MED ORDER — WITCH HAZEL-GLYCERIN EX PADS: 1.0000 "application " | MEDICATED_PAD | CUTANEOUS | Status: DC | PRN

## 2015-09-30 MED ORDER — DIPHENHYDRAMINE HCL 50 MG/ML IJ SOLN: 12.5000 mg | INTRAMUSCULAR | Status: DC | PRN

## 2015-09-30 MED ORDER — ACETAMINOPHEN 160 MG/5ML PO SOLN: 10.0000 mg/kg | Freq: Once | ORAL | Status: DC

## 2015-09-30 MED ORDER — LACTATED RINGERS IV SOLN
Freq: Once | INTRAVENOUS | Status: AC
  Administered 2015-09-30: 06:00:00 via INTRAVENOUS

## 2015-09-30 MED ORDER — COCONUT OIL OIL
1.0000 "application " | TOPICAL_OIL | Status: DC | PRN
  Filled 2015-09-30: qty 120

## 2015-09-30 MED ORDER — IBUPROFEN 600 MG PO TABS
600.0000 mg | ORAL_TABLET | Freq: Four times a day (QID) | ORAL | Status: DC
  Administered 2015-09-30 – 2015-10-02 (×7): 600 mg via ORAL
  Filled 2015-09-30 (×3): qty 1

## 2015-09-30 SURGICAL SUPPLY — 25 items
BAG COUNTER SPONGE EZ (MISCELLANEOUS) ×2 IMPLANT
CANISTER SUCT 3000ML (MISCELLANEOUS) ×3 IMPLANT
CHLORAPREP W/TINT 26ML (MISCELLANEOUS) ×6 IMPLANT
COUNTER SPONGE BAG EZ (MISCELLANEOUS) ×1
DRSG TELFA 3X8 NADH (GAUZE/BANDAGES/DRESSINGS) ×3 IMPLANT
ELECT REM PT RETURN 9FT ADLT (ELECTROSURGICAL) ×3
ELECTRODE REM PT RTRN 9FT ADLT (ELECTROSURGICAL) ×1 IMPLANT
GAUZE SPONGE 4X4 12PLY STRL (GAUZE/BANDAGES/DRESSINGS) ×3 IMPLANT
GLOVE BIO SURGEON STRL SZ 6 (GLOVE) ×3 IMPLANT
GLOVE BIOGEL PI IND STRL 6.5 (GLOVE) ×1 IMPLANT
GLOVE BIOGEL PI INDICATOR 6.5 (GLOVE) ×2
GOWN STRL REUS W/ TWL LRG LVL3 (GOWN DISPOSABLE) ×2 IMPLANT
GOWN STRL REUS W/TWL LRG LVL3 (GOWN DISPOSABLE) ×4
HEMOSTAT ARISTA ABSORB 3G PWDR (MISCELLANEOUS) ×3 IMPLANT
KIT RM TURNOVER STRD PROC AR (KITS) ×3 IMPLANT
NS IRRIG 1000ML POUR BTL (IV SOLUTION) ×3 IMPLANT
PACK C SECTION AR (MISCELLANEOUS) ×3 IMPLANT
PAD OB MATERNITY 4.3X12.25 (PERSONAL CARE ITEMS) ×3 IMPLANT
PAD PREP 24X41 OB/GYN DISP (PERSONAL CARE ITEMS) ×3 IMPLANT
SPONGE LAP 18X18 5 PK (GAUZE/BANDAGES/DRESSINGS) ×6 IMPLANT
SUT MNCRL AB 4-0 PS2 18 (SUTURE) ×3 IMPLANT
SUT PLAIN 2 0 XLH (SUTURE) IMPLANT
SUT VIC AB 0 CT1 36 (SUTURE) ×15 IMPLANT
SUT VIC AB 3-0 SH 27 (SUTURE) ×2
SUT VIC AB 3-0 SH 27X BRD (SUTURE) ×1 IMPLANT

## 2015-09-30 NOTE — H&P (Signed)
Obstetric Preoperative History and Physical  Ann Howell is a 30 y.o. [phone] with IUP at 107w2d presenting for presenting for scheduled repeat cesarean section with bilateral tubal ligation.  Patient with prior h/o C-section x 3. No acute concerns.   Prenatal Course Source of Care: [facility] with onset of care at 30 weeks (transfer of care from [facility]) Pregnancy complications or risks: Patient Active Problem List   Diagnosis Date Noted  . Pregnancy 09/15/2015  . Preterm contractions 08/21/2015  . History of cesarean section 07/31/2015  . First trimester screening 03/14/2015  . Adult BMI 30+ 02/15/2015  . Supervision of normal pregnancy in third trimester 02/15/2015   She plans to breastfeed She desires bilateral tubal ligation for postpartum contraception.   Prenatal labs and studies: ABO, Rh: --/--/B POS (04/21 0919) Antibody: NEG (04/21 0918) Rubella:  Immune (09/09) RPR: Non Reactive (04/21 0918)  HBsAg:   Negative (09/09) HIV:   Non-reactive (-4/21 4540) GBS: Negative (03/30 1457) 1 hr Glucola  Normal (98) Genetic screening normal Anatomy US normal   Past Medical History  Diagnosis Date  . History of C-section   . Obesity   . History of pregnancy induced hypertension     third trimester    Past Surgical History  Procedure Laterality Date  . Cesarean section      x3    OB History  Gravida Para Term Preterm AB SAB TAB Ectopic Multiple Living  # Outcome Date GA Lbr Len/2nd Weight Sex Delivery Anes PTL Lv  6 Current           5 Term 04/10/13    M CS-Unspec   Y  4 Term 04/09/10    F CS-Unspec   Y  3 Term 03/16/09    F CS-Unspec   Y  2 SAB 10/07/06          1 TAB 03/13/04              Social History   Social History  . Marital Status: Significant Other    Spouse Name: N/A  . Number of Children: N/A  . Years of Education: N/A   Social History Main Topics  . Smoking status: Never Smoker   . Smokeless tobacco:  Never Used  . Alcohol Use: No  . Drug Use: No  . Sexual Activity:    Partners: Male   Other Topics Concern  . None   Social History Narrative    Family History  Problem Relation Age of Onset  . Hyperlipidemia Father   . Hypertension Father   . Hypertension Mother   . Thyroid disease Mother   . Thyroid disease Sister     No prescriptions prior to admission    No Known Allergies  Review of Systems: Negative except for what is mentioned in HPI.  Physical Exam: BP 114/70 mmHg  Pulse 107  Temp(Src) 98.3 F (36.8 C) (Oral)  Resp 18  Ht  (1.575 m)  Wt 207 lb (93.895 kg)  BMI 37.85 kg/m2  LMP 12/08/2014 (Approximate) FHR by Doppler: 135 bpm GENERAL: Well-developed, well-nourished female in no acute distress. Obesity. LUNGS: Clear to auscultation bilaterally.  HEART: Regular rate and rhythm. ABDOMEN: Soft, nontender, nondistended, gravid, well-healed Pfannenstiel incision. PELVIC: Deferred EXTREMITIES: Nontender, no edema, 2+ distal pulses.   Pertinent Labs/Studies:   Results for orders placed or performed during the hospital encounter of 09/27/15 (from the past 72  hour(s))  CBC     Status: Abnormal   Collection Time: 09/27/15  9:18 AM  Result Value Ref Range   WBC 12.6 (H) 3.6 - 11.0 K/uL   RBC 3.72 (L) 3.80 - 5.20 MIL/uL   Hemoglobin 11.0 (L) 12.0 - 16.0 g/dL   HCT 16.132.5 (L) 09.635.0 - 04.547.0 %   MCV 87.4 80.0 - 100.0 fL   MCH 29.5 26.0 - 34.0 pg   MCHC 33.7 32.0 - 36.0 g/dL   RDW 40.914.8 (H) 81.111.5 - 91.414.5 %   Platelets 217 150 - 440 K/uL  Rapid HIV screen (HIV 1/2 Ab+Ag)     Status: None   Collection Time: 09/27/15  9:18 AM  Result Value Ref Range   HIV-1 P24 Antigen - HIV24 NON REACTIVE NON REACTIVE   HIV 1/2 Antibodies NON REACTIVE NON REACTIVE   Interpretation (HIV Ag Ab)      A non reactive test result means that HIV 1 or HIV 2 antibodies and HIV 1 p24 antigen were not detected in the specimen.  RPR     Status: None   Collection Time: 09/27/15  9:18 AM   Result Value Ref Range   RPR Ser Ql Non Reactive Non Reactive    Comment: (NOTE) Performed At: [facility] [address] [place], [place] [id number] Mila HomerHancock William F MD YQ:[id number]:[phone]   Type and screen [facility]     Status: None   Collection Time: 09/27/15  9:18 AM  Result Value Ref Range   ABO/RH(D) B POS    Antibody Screen NEG    Sample Expiration 09/30/2015    Extend sample reason PREGNANT WITHIN 3 MONTHS, UNABLE TO EXTEND   ABO/Rh     Status: None   Collection Time: 09/27/15  9:19 AM  Result Value Ref Range   ABO/RH(D) B POS     Assessment and Plan :Servando SnareRachel Brendel is a 30 y.o. [id number] at 660w2d being admitted  for scheduled cesarean section delivery with BTL . The patient is understanding of the planned procedure and is aware of and accepting of all surgical risks, including but not limited to: bleeding which may require transfusion or reoperation; infection which may require antibiotics; injury to bowel, bladder, ureters or other surrounding organs which may require repair; injury to the fetus; need for additional procedures including hysterectomy in the event of life-threatening complications; placental abnormalities wth subsequent pregnancies; incisional problems; blood clot disorders which may require blood thinners;, and other postoperative/anesthesia complications. The patient is in agreement with the proposed plan, and has given  informed written consent for the procedure. All questions have been answered. Patient desires permanent sterilization.  Other reversible forms of contraception were discussed with patient; she declines all other modalities. Risks of procedure discussed with patient including but not limited to: risk of regret, permanence of method, bleeding, infection, injury to surrounding organs and need for additional procedures.  Failure risk of 1-2 % with increased risk of ectopic gestation if pregnancy occurs was also discussed with  patient.  Patient verbalized understanding of these risks and wants to proceed with sterilization.  Written informed consent has previously been obtained.  To OR when ready.   Hildred LaserAnika Vertie Dibbern, MD [facility]

## 2015-09-30 NOTE — Op Note (Signed)
Cesarean Section Procedure Note  Indications: previous uterine incision: low transverse Cesarean section x 3  Pre-operative Diagnosis: 39 week 2 day pregnancy, obesity, h/o prior C-section x 3, multiparity desiring permanent sterilization.  Post-operative Diagnosis: same, with pelvic adhesions  Surgeon: Hildred Laser, MD  Assistants: Sharon Seller, MD  Procedure: Repeat low transverse Cesarean Section with bilateral tubal ligation  Anesthesia: Spinal anesthesia  ASA Class: II  Procedure Details: The patient was seen in the Holding Room. The risks, benefits, complications, treatment options, and expected outcomes were discussed with the patient.  The patient concurred with the proposed plan, giving informed consent.  The site of surgery properly noted/marked. The patient was taken to the Operating Room, identified as Ann Howell and the procedure verified as C-Section Delivery. A Time Out was held and the above information confirmed.  After induction of anesthesia, the patient was draped and prepped in the usual sterile manner. Anesthesia was tested and noted to be adequate. A Pfannenstiel incision was made and carried down through the subcutaneous tissue to the fascia. Fascial incision was made and extended transversely. The fascia was separated from the underlying rectus tissue superiorly and inferiorly. The peritoneum was identified and entered. Peritoneal incision was extended longitudinally. The utero-vesical peritoneal reflection was incised transversely and the bladder flap was bluntly freed from the lower uterine segment. The lower uterine segment was noted to be a peritoneal window. A low transverse uterine incision was made. Delivered from cephalic presentation was a 3440 gram Female with Apgar scores of 8 at one minute and 9 at five minutes.   The umbilical cord was clamped and cut. The placenta was removed intact and appeared normal. The uterus was exteriorized and cleared of all  clots and debris. The uterine outline, tubes and ovaries appeared normal. There were filmy adhesions of the large bowel epiploica to the left adnexa.  The uterine incision was closed with running locked sutures of 0-Vicryl.  A second suture of 0-Vicryl was used in an imbricating layer. Several figure of eight sutures were placed along the incision line to achieve hemostasis.  Hemostasis was observed.    Attention was then turned to the fallopian tubes, and where the patient's right fallopian tube was identified and grasped with a Babcock clamp.  The tube was then followed out to the fimbria.  The Babcock clamp was then used to grasp the tube approximately 4 cm from the cornual region.  A 3 cm segment of tube was then ligated with a free tie of 0-Chromic using the Pomeroy method and excised.  The left fallopian tube was then ligated in a similar fashion and excised. The tubal lumens were cauterized bilaterally.  The right fallopian tube proximal end was noted to have slippage from the knot.  The fallopian tube segments were then suture-ligated with 0--Chromic with good hemostasis achieved.    The filmy adhesions of the bowel to the left adnexa were lysed with the Bovie.  The uterus was then returned to the abdomen.   Arista was placed over the uterine incision site to achieve better hemostasis, as slight oozing was still noted.   Lavage was carried out until clear. The fascia was then reapproximated with a running suture of 0 Vicryl. The subcutaneous fat layer was reapproximated with 3-0 Vicryl.  The skin was reapproximated with 4-0 Monocryl.  Instrument, sponge, and needle counts were correct prior the abdominal closure and at the conclusion of the case.   Findings: Female infant, cephalic presentation, 3440 grams, with Apgar  scores of 8 at one minute and 9 at five minutes. Intact placenta with 3 vessel cord.  The uterine outline, tubes and ovaries appeared normal.   Estimated Blood Loss:  750 ml    Drains: foley catheter to gravity drainage, 100 ml clear urine at end of the procedure         Total IV Fluids:  900 ml of Lactated Ringer  Specimens: Placenta and Disposition:  Sent to Pathology         Implants: None         Complications:  None; patient tolerated the procedure well.         Disposition: PACU - hemodynamically stable.         Condition: stable   Hildred LaserAnika Dodger Sinning, MD [facility]

## 2015-09-30 NOTE — Anesthesia Preprocedure Evaluation (Signed)
Anesthesia Evaluation  Patient identified by MRN, date of birth, ID band Patient awake    Reviewed: Allergy & Precautions, NPO status , Patient's Chart, lab work & pertinent test results  History of Anesthesia Complications Negative for: history of anesthetic complications  Airway Mallampati: III       Dental   Pulmonary neg pulmonary ROS,           Cardiovascular negative cardio ROS       Neuro/Psych negative neurological ROS     GI/Hepatic negative GI ROS, Neg liver ROS,   Endo/Other  negative endocrine ROS  Renal/GU negative Renal ROS     Musculoskeletal   Abdominal   Peds  Hematology negative hematology ROS (+)   Anesthesia Other Findings   Reproductive/Obstetrics                             Anesthesia Physical Anesthesia Plan  ASA: II  Anesthesia Plan: Spinal   Post-op Pain Management:    Induction:   Airway Management Planned: Nasal Cannula  Additional Equipment:   Intra-op Plan:   Post-operative Plan:   Informed Consent: I have reviewed the patients History and Physical, chart, labs and discussed the procedure including the risks, benefits and alternatives for the proposed anesthesia with the patient or authorized representative who has indicated his/her understanding and acceptance.     Plan Discussed with:   Anesthesia Plan Comments:         Anesthesia Quick Evaluation

## 2015-09-30 NOTE — Transfer of Care (Signed)
Immediate Anesthesia Transfer of Care Note  Patient: Ann SnareRachel Howell  Procedure(s) Performed: Procedure(s): REPEAT CESAREAN SECTION WITH BILATERAL TUBAL LIGATION (Bilateral)  Patient Location: PACU and Women's Unit  Anesthesia Type:Spinal  Level of Consciousness: awake, alert  and oriented  Airway & Oxygen Therapy: Patient Spontanous Breathing  Post-op Assessment: Report given to RN and Post -op Vital signs reviewed and stable  Post vital signs: Reviewed and stable  Last Vitals:  Filed Vitals:   09/30/15 0557  BP: 114/70  Pulse: 107  Temp: 36.8 C  Resp: 18    Complications: No apparent anesthesia complications

## 2015-10-01 LAB — CBC
HEMATOCRIT: 30.1 % — AB (ref 35.0–47.0)
Hemoglobin: 10.2 g/dL — ABNORMAL LOW (ref 12.0–16.0)
MCH: 29.8 pg (ref 26.0–34.0)
MCHC: 33.8 g/dL (ref 32.0–36.0)
MCV: 88.2 fL (ref 80.0–100.0)
Platelets: 180 10*3/uL (ref 150–440)
RBC: 3.42 MIL/uL — ABNORMAL LOW (ref 3.80–5.20)
RDW: 15 % — AB (ref 11.5–14.5)
WBC: 11.7 10*3/uL — ABNORMAL HIGH (ref 3.6–11.0)

## 2015-10-01 LAB — SURGICAL PATHOLOGY

## 2015-10-01 NOTE — Progress Notes (Signed)
Postpartum Day # 1: Cesarean Delivery  Subjective: Patient reports tolerating PO and + flatus.  Denies nausea/vomiting, or difficulties voiding.   Objective: Vital signs in last 24 hours: Temp:  [97.5 F (36.4 C)-98.9 F (37.2 C)] 98.6 F (37 C) (04/25 0730) Pulse Rate:  [73-95] 76 (04/25 0730) Resp:  [16-20] 18 (04/25 0730) BP: (68-113)/(43-90) 109/50 mmHg (04/25 0730) SpO2:  [94 %-99 %] 96 % (04/25 0730)  Physical Exam:  General: alert and no distress Lungs: clear to auscultation bilaterally Breasts: normal appearance, no masses or tenderness Heart: regular rate and rhythm, S1, S2 normal, no murmur, click, rub or gallop Pelvis: Lochia appropriate, Uterine Fundus firm, Incision: healing well, no significant drainage, no dehiscence, no significant erythema Extremities: DVT Evaluation: No cords or calf tenderness. No significant calf/ankle edema. Positive Homan's sign.   Recent Labs CBC Latest Ref Rng 10/01/2015 09/27/2015  WBC 3.6 - 11.0 K/uL 11.7(H) 12.6(H)  Hemoglobin 12.0 - 16.0 g/dL 10.2(L) 11.0(L)  Hematocrit 35.0 - 47.0 % 30.1(L) 32.5(L)  Platelets 150 - 440 K/uL 180 217    Assessment/Plan: Status post Cesarean section. Doing well postoperatively.  Breastfeeding and Contraception BTL Advance diet Continue PO pain management Encourage ambulation Discontinue IVF Continue current care. Dispo: can d/c home in 1-2 days.     Hildred LaserAnika Jerri Hargadon [facility]

## 2015-10-01 NOTE — Anesthesia Post-op Follow-up Note (Signed)
  Anesthesia Pain Follow-up Note  Patient: Ann SnareRachel Howell  Day #: 1  Date of Follow-up: 10/01/2015 Time: 7:20 AM  Last Vitals:  Filed Vitals:   09/30/15 2333 10/01/15 0412  BP: 109/57 90/55  Pulse: 78 73  Temp:    Resp: 20 18    Level of Consciousness: alert  Pain: mild   Side Effects:None  Catheter Site Exam:clean, dry, no drainage  Plan: D/C from anesthesia care  Michaele OfferSavage,  Basya Casavant A

## 2015-10-01 NOTE — Anesthesia Postprocedure Evaluation (Signed)
Anesthesia Post Note  Patient: Ann SnareRachel Mckellar  Procedure(s) Performed: Procedure(s) (LRB): REPEAT CESAREAN SECTION WITH BILATERAL TUBAL LIGATION (Bilateral)  Patient location during evaluation: Mother Baby Anesthesia Type: Spinal Level of consciousness: awake and alert, oriented and patient cooperative Pain management: satisfactory to patient Vital Signs Assessment: post-procedure vital signs reviewed and stable Respiratory status: respiratory function stable Cardiovascular status: stable Postop Assessment: no headache, no backache, patient able to bend at knees, no signs of nausea or vomiting and adequate PO intake Anesthetic complications: no    Last Vitals:  Filed Vitals:   09/30/15 2333 10/01/15 0412  BP: 109/57 90/55  Pulse: 78 73  Temp:    Resp: 20 18    Last Pain:  Filed Vitals:   10/01/15 0655  PainSc: 5                  Patti Shorb,  Tatem Holsonback A

## 2015-10-02 MED ORDER — IBUPROFEN 600 MG PO TABS
600.0000 mg | ORAL_TABLET | Freq: Four times a day (QID) | ORAL | Status: DC
  Administered 2015-10-02 – 2015-10-03 (×5): 600 mg via ORAL
  Filled 2015-10-02 (×2): qty 1

## 2015-10-02 MED ORDER — BREAST MILK
ORAL | Status: DC
  Filled 2015-10-02: qty 1

## 2015-10-02 NOTE — Progress Notes (Signed)
Postpartum Day # 2: Cesarean Delivery  Subjective: Patient reports tolerating PO and + flatus. Not feeling sharp abdominal pain as she was yesterday (was given simethicone and milk of magnesia without much relief for gas pain).   Denies nausea/vomiting, or difficulties voiding. Is ambulating without difficulty.   Objective: Vital signs in last 24 hours: Temp:  [97.6 F (36.4 C)-98.4 F (36.9 C)] 98.1 F (36.7 C) (04/26 1115) Pulse Rate:  [77-94] 94 (04/26 0730) Resp:  [17-18] 18 (04/26 0730) BP: (109-127)/(59-72) 109/72 mmHg (04/26 0730) SpO2:  [96 %] 96 % (04/26 0730)  Physical Exam:  General: alert and no distress Lungs: clear to auscultation bilaterally Breasts: normal appearance, no masses or tenderness Heart: regular rate and rhythm, S1, S2 normal, no murmur, click, rub or gallop  Abdomen: positive bowel sounds present, nontender.  Pelvis: Lochia appropriate, Uterine Fundus firm, Incision: healing well, no significant drainage, no dehiscence, no significant erythema Extremities: DVT Evaluation: No cords or calf tenderness. No significant calf/ankle edema. Positive Homan's sign.   Recent Labs CBC Latest Ref Rng 10/01/2015 09/27/2015  WBC 3.6 - 11.0 K/uL 11.7(H) 12.6(H)  Hemoglobin 12.0 - 16.0 g/dL 10.2(L) 11.0(L)  Hematocrit 35.0 - 47.0 % 30.1(L) 32.5(L)  Platelets 150 - 440 K/uL 180 217    Assessment/Plan: Status post Cesarean section. Doing well postoperatively.  Breastfeeding and Contraception BTL Regular diet as tolerated Continue PO pain management Continue to encourage ambulation Continue current care. Dispo: will d/c home tomorrow.      Hildred LaserAnika Betty Daidone [facility]

## 2015-10-03 MED ORDER — PRENATAL MULTIVITAMIN CH: 1.0000 | ORAL_TABLET | Freq: Every day | ORAL | Status: DC

## 2015-10-03 MED ORDER — DOCUSATE SODIUM 100 MG PO CAPS: 100.0000 mg | ORAL_CAPSULE | Freq: Two times a day (BID) | ORAL | Status: DC | PRN

## 2015-10-03 MED ORDER — OXYCODONE-ACETAMINOPHEN 5-325 MG PO TABS: 1.0000 | ORAL_TABLET | Freq: Four times a day (QID) | ORAL | Status: DC | PRN

## 2015-10-03 MED ORDER — IBUPROFEN 800 MG PO TABS: 800.0000 mg | ORAL_TABLET | Freq: Three times a day (TID) | ORAL | Status: DC | PRN

## 2015-10-03 NOTE — Progress Notes (Signed)
D/C order from MD.  Reviewed d/c instructions and prescriptions with patient and answered any questions.  Patient d/c home with infant via wheelchair by nursing/auxillary. 

## 2015-10-03 NOTE — Discharge Instructions (Signed)
Please call your doctor or return to the ER if you experience any chest pains, shortness of breath, fever greater than 101, any heavy bleeding or large clots, and foul smelling vaginal discharge, any worsening abdominal pain & cramping that is not controlled by pain medication, or any signs of post partum depression.  No tampons, enemas, douches, or sexual intercourse for 6 weeks.  Also avoid tub baths, hot tubs, or swimming for 6 weeks. ° ° ° °Postpartum Care After Vaginal Delivery °After you deliver your newborn (postpartum period), the usual stay in the hospital is 24-72 hours. If there were problems with your labor or delivery, or if you have other medical problems, you might be in the hospital longer.  °While you are in the hospital, you will receive help and instructions on how to care for yourself and your newborn during the postpartum period.  °While you are in the hospital: °· Be sure to tell your nurses if you have pain or discomfort, as well as where you feel the pain and what makes the pain worse. °· If you had an incision made near your vagina (episiotomy) or if you had some tearing during delivery, the nurses may put ice packs on your episiotomy or tear. The ice packs may help to reduce the pain and swelling. °· If you are breastfeeding, you may feel uncomfortable contractions of your uterus for a couple of weeks. This is normal. The contractions help your uterus get back to normal size. °· It is normal to have some bleeding after delivery. °¨ For the first 1-3 days after delivery, the flow is red and the amount may be similar to a period. °¨ It is common for the flow to start and stop. °¨ In the first few days, you may pass some small clots. Let your nurses know if you begin to pass large clots or your flow increases. °¨ Do not  flush blood clots down the toilet before having the nurse look at them. °¨ During the next 3-10 days after delivery, your flow should become more watery and pink or  brown-tinged in color. °¨ Ten to fourteen days after delivery, your flow should be a small amount of yellowish-white discharge. °¨ The amount of your flow will decrease over the first few weeks after delivery. Your flow may stop in 6-8 weeks. Most women have had their flow stop by 12 weeks after delivery. °· You should change your sanitary pads frequently. °· Wash your hands thoroughly with soap and water for at least 20 seconds after changing pads, using the toilet, or before holding or feeding your newborn. °· You should feel like you need to empty your bladder within the first 6-8 hours after delivery. °· In case you become weak, lightheaded, or faint, call your nurse before you get out of bed for the first time and before you take a shower for the first time. °· Within the first few days after delivery, your breasts may begin to feel tender and full. This is called engorgement. Breast tenderness usually goes away within 48-72 hours after engorgement occurs. You may also notice milk leaking from your breasts. If you are not breastfeeding, do not stimulate your breasts. Breast stimulation can make your breasts produce more milk. °· Spending as much time as possible with your newborn is very important. During this time, you and your newborn can feel close and get to know each other. Having your newborn stay in your room (rooming in) will help to strengthen   the bond with your newborn.  It will give you time to get to know your newborn and become comfortable caring for your newborn. °· Your hormones change after delivery. Sometimes the hormone changes can temporarily cause you to feel sad or tearful. These feelings should not last more than a few days. If these feelings last longer than that, you should talk to your caregiver. °· If desired, talk to your caregiver about methods of family planning or contraception. °· Talk to your caregiver about immunizations. Your caregiver may want you to have the following  immunizations before leaving the hospital: °¨ Tetanus, diphtheria, and pertussis (Tdap) or tetanus and diphtheria (Td) immunization. It is very important that you and your family (including grandparents) or others caring for your newborn are up-to-date with the Tdap or Td immunizations. The Tdap or Td immunization can help protect your newborn from getting ill. °¨ Rubella immunization. °¨ Varicella (chickenpox) immunization. °¨ Influenza immunization. You should receive this annual immunization if you did not receive the immunization during your pregnancy. °  °This information is not intended to replace advice given to you by your health care provider. Make sure you discuss any questions you have with your health care provider. °  °Document Released: 03/22/2007 Document Revised: 02/17/2012 Document Reviewed: 01/20/2012 °Elsevier Interactive Patient Education ©2016 [employer] ° °

## 2015-10-03 NOTE — Lactation Note (Signed)
This note was copied from a baby's chart. Lactation Consultation Note  Patient Name: Ann Howell HYQMV'HToday's Date: 10/03/2015 Reason for consult: Initial assessment   Maternal Data  Mother reports great breast feeding and is an experienced breast feeding mother.  Feeding Feeding Type: Breast Fed Length of feed: 16 min (right)  LATCH Score/Interventions Latch: Grasps breast easily, tongue down, lips flanged, rhythmical sucking.  Audible Swallowing: Spontaneous and intermittent  Type of Nipple: Everted at rest and after stimulation        Hold (Positioning): No assistance needed to correctly position infant at breast.          Trudee Griparolyn P Alfard Cochrane 10/03/2015, 11:37 AM

## 2015-10-03 NOTE — Discharge Summary (Signed)
Obstetric Discharge Summary Reason for Admission: cesarean section Prenatal Procedures: ultrasound Intrapartum Procedures: cesarean: low cervical, transverse and tubal ligation Postpartum Procedures: none Complications-Operative and Postpartum: none HEMOGLOBIN  Date Value Ref Range Status  10/01/2015 10.2* 12.0 - 16.0 g/dL Final   HCT  Date Value Ref Range Status  10/01/2015 30.1* 35.0 - 47.0 % Final    Physical Exam:  General: alert and no distress Lochia: appropriate Uterine Fundus: firm Incision: healing well DVT Evaluation: Negative Homan's sign. No cords or calf tenderness. No significant calf/ankle edema.  Discharge Diagnoses: Term Pregnancy-delivered by repeat C-section, h/o prior C-section x 3, Obesity in pregnancy  Discharge Information: Date: 10/03/2015 Activity: pelvic rest Diet: routine Medications: PNV, Ibuprofen, Colace and Percocet Condition: stable Instructions: refer to practice specific booklet Discharge to: home Follow-up Information    Follow up with Ann LaserAnika Tayonna Bacha, MD In 1 week.   Specialties:  Obstetrics and Gynecology, Radiology   Why:  For wound re-check   Contact information:   [address] [place] [address] [phone]       Newborn Data: Live born female  Birth Weight: 7 lb 9.3 oz (3440 g) APGAR: 8, 9  Home with mother.  Ann Howell 10/03/2015, 7:49 AM

## 2015-10-03 NOTE — Progress Notes (Signed)
Postpartum Day #32: Cesarean Delivery  Subjective: Patient denies complaints. Ambulating, tolerating diet, voiding and passing flatus without difficulty. Has had BM.   Objective:  Blood pressure 105/68, pulse 89, temperature 98.7 F (37.1 C), temperature source Oral, resp. rate 18, height 5\' 2"  (1.575 m), weight 207 lb (93.895 kg), last menstrual period 12/08/2014, SpO2 100 %, unknown if currently breastfeeding.  Physical Exam:  General: alert and no distress Lungs: clear to auscultation bilaterally Breasts: normal appearance, no masses or tenderness Heart: regular rate and rhythm, S1, S2 normal, no murmur, click, rub or gallop  Abdomen: positive bowel sounds present, nontender.  Pelvis: Lochia appropriate, Uterine Fundus firm, Incision: healing well, no significant drainage, no dehiscence, no significant erythema Extremities: DVT Evaluation: No cords or calf tenderness. No significant calf/ankle edema. Positive Homan's sign.   Recent Labs CBC Latest Ref Rng 10/01/2015 09/27/2015  WBC 3.6 - 11.0 K/uL 11.7(H) 12.6(H)  Hemoglobin 12.0 - 16.0 g/dL 10.2(L) 11.0(L)  Hematocrit 35.0 - 47.0 % 30.1(L) 32.5(L)  Platelets 150 - 440 K/uL 180 217    Assessment/Plan: Status post Cesarean section. Doing well postoperatively.  Breastfeeding and Contraception BTL Dispo: will d/c home today.      Hildred LaserAnika Kesean Serviss [facility]

## 2015-10-16 ENCOUNTER — Encounter: Payer: Medicaid Other | Admitting: Obstetrics and Gynecology

## 2015-10-17 ENCOUNTER — Encounter: Payer: Medicaid Other | Admitting: Obstetrics and Gynecology

## 2015-11-12 ENCOUNTER — Ambulatory Visit: Payer: Medicaid Other | Admitting: Obstetrics and Gynecology

## 2016-01-08 ENCOUNTER — Encounter: Payer: Self-pay | Admitting: Obstetrics and Gynecology

## 2016-01-08 ENCOUNTER — Ambulatory Visit (INDEPENDENT_AMBULATORY_CARE_PROVIDER_SITE_OTHER): Payer: Medicaid Other | Admitting: Obstetrics and Gynecology

## 2016-01-08 DIAGNOSIS — Z9851 Tubal ligation status: Secondary | ICD-10-CM | POA: Diagnosis not present

## 2016-01-08 DIAGNOSIS — E669 Obesity, unspecified: Secondary | ICD-10-CM

## 2016-01-08 NOTE — Progress Notes (Signed)
   OBSTETRICS POSTPARTUM CLINIC PROGRESS NOTE  Subjective:     Ann Howell is a 30 y.o. [phone] female who presents for a postpartum visit. She is 13 weeks postpartum following a low cervical transverse Cesarean section with BTL. I have fully reviewed the prenatal and intrapartum course. The delivery was at 39 gestational weeks.  Anesthesia: spinal. Postpartum course has been well. Baby's course has been well. Baby is feeding by breast. Bleeding: patient has resumed menses (June, was very light, but nothing since then), with last menstrual period was 11/14/2015 (approximate). Bowel function is normal. Bladder function is normal. Patient is sexually active. Contraception method is tubal ligation. Postpartum depression screening: negative (PHQ-9 score is 0).  The following portions of the patient's history were reviewed and updated as appropriate: allergies, current medications, past family history, past medical history, past social history, past surgical history and problem list.  Review of Systems Constitutional: positive for weight gain   Objective:    BP 110/72   Pulse 80   Ht 5\' 2"  (1.575 m)   Wt 190 lb 8 oz (86.4 kg)   LMP 11/14/2015 (Approximate)   Breastfeeding? Yes   BMI 34.84 kg/m   General:  alert and no distress   Breasts:  inspection negative, no nipple discharge or bleeding, no masses or nodularity palpable  Lungs: clear to auscultation bilaterally  Heart:  regular rate and rhythm, S1, S2 normal, no murmur, click, rub or gallop  Abdomen: soft, non-tender; bowel sounds normal; no masses,  no organomegaly.  Well healed Pfannenstiel incision   Vulva:  normal  Vagina: normal vagina, no discharge, exudate, lesion, or erythema  Cervix:  no cervical motion tenderness and no lesions  Corpus: normal size, contour, position, consistency, mobility, non-tender  Adnexa:  normal adnexa and no mass, fullness, tenderness  Rectal Exam: Not performed.         Labs:  Lab Results    Component Value Date   HGB 10.2 (L) 10/01/2015     Assessment:   Routine postpartum exam s/p repeat C-section with BTL.   Obesity (Class 1)  Plan:   1. Contraception: tubal ligation 2. Obesity - patient desires to discuss management for weight loss.  Notes that she is starting to modify diet and trying to become more active.  Notes being on weight loss medications in the past.  Desires to reinitiate. Will refer to [facility], CNM for further management of weight loss.  Will also refer to nutritionist to assist with dietary management.   3. Follow up in: 3-6 months for annual exam, or as needed.    Hildred Laser, MD [facility]

## 2016-02-12 ENCOUNTER — Ambulatory Visit: Payer: Medicaid Other | Admitting: Obstetrics and Gynecology

## 2016-05-13 ENCOUNTER — Encounter: Payer: Medicaid Other | Admitting: Obstetrics and Gynecology

## 2016-05-21 ENCOUNTER — Ambulatory Visit (INDEPENDENT_AMBULATORY_CARE_PROVIDER_SITE_OTHER): Payer: Medicaid Other | Admitting: Obstetrics and Gynecology

## 2016-05-21 ENCOUNTER — Encounter: Payer: Self-pay | Admitting: Obstetrics and Gynecology

## 2016-05-21 VITALS — BP 128/84 | HR 88 | Ht 62.0 in | Wt 168.7 lb

## 2016-05-21 DIAGNOSIS — E669 Obesity, unspecified: Secondary | ICD-10-CM | POA: Diagnosis not present

## 2016-05-21 MED ORDER — PHENTERMINE HCL 37.5 MG PO TABS: 37.5000 mg | ORAL_TABLET | Freq: Every day | ORAL | 2 refills | Status: AC

## 2016-05-21 MED ORDER — CYANOCOBALAMIN 1000 MCG/ML IJ SOLN: 1000.0000 ug | INTRAMUSCULAR | 1 refills | Status: AC

## 2016-05-21 NOTE — Patient Instructions (Signed)
Consider the Low Glycemic Index Diet and 6 smaller meals daily .  This boosts your metabolism and regulates your sugars:   Use the protein bar by Atkins because they have lots of fiber in them  Find the low carb flatbreads, tortillas and pita breads for sandwiches:  Joseph's makes a pita bread and a flat bread , available at [facility] and [employer]; Toufayah makes a low carb flatbread available at [employer] and [employer] that is 9 net carbs and 100 cal Mission makes a low carb whole wheat tortilla available at [employer],and most grocery stores with 6 net carbs and 210 cal  [place] yogurt can still have a lot of carbs .  Dannon Light N fit has 80 cal and 8 carbs    Thank you for enrolling in MyChart. Please follow the instructions below to securely access your online medical record. MyChart allows you to send messages to your doctor, view your test results, renew your prescriptions, schedule appointments, and more.  How Do I Sign Up? 1. In your Internet browser, go to [email or web address]. 2. Click on the New  User? link in the Sign In box.  3. Enter your MyChart Access Code exactly as it appears below. You will not need to use this code after you have completed the sign-up process. If you do not sign up before the expiration date, you must request a new code. MyChart Access Code: [email or web address] Expires: 07/20/2016 10:26 AM  4. Enter the last four digits of your Social Security Number (xxxx) and Date of Birth (mm/dd/yyyy) as indicated and click Next. You will be taken to the next sign-up page. 5. Create a MyChart ID. This will be your MyChart login ID and cannot be changed, so think of one that is secure and easy to remember. 6. Create a MyChart password. You can change your password at any time. 7. Enter your Password Reset Question and Answer and click Next. This can be used at a later time if you forget your password.  8. Select your communication preference, and if applicable enter your  e-mail address. You will receive e-mail notification when new information is available in MyChart by choosing to receive e-mail notifications and filling in your e-mail. 9. Click Sign In. You can now view your medical record.   Additional Information If you have questions, you can email [email or web address] or call [phone] to talk to our MyChart staff. Remember, MyChart is NOT to be used for urgent needs. For medical emergencies, dial 911.

## 2016-05-21 NOTE — Progress Notes (Signed)
Subjective:  Ann Howell is a 30 y.o. [phone] at Unknown being seen today for weight loss management- initial visit.  Patient reports General ROS: negative and reports previous weight loss attempts: Management changes made at the last visit include: phentermine use with regular exercise- lost 50#s, desires restarting.  The following portions of the patient's history were reviewed and updated as appropriate: allergies, current medications, past family history, past medical history, past social history, past surgical history and problem list.   Objective:   Vitals:   05/21/16 0959  BP: 128/84  Pulse: 88  Weight: 168 lb 11.2 oz (76.5 kg)  Height: 5\' 2"  (1.575 m)    General:  Alert, oriented and cooperative. Patient is in no acute distress.  :   :   :   :   :   :   PE: Well groomed female in no current distress,   Mental Status: Normal mood and affect. Normal behavior. Normal judgment and thought content.   Current BMI: Body mass index is 30.86 kg/m.   Assessment and Plan:  Obesity  1. Obesity (BMI 30-39.9)   Plan: low carb, High protein diet RX for adipex 37.5 mg daily and B12 1000mcg.ml monthly, to start now with first injection given at today's visit. Reviewed side-effects common to both medications and expected outcomes. Increase daily water intake to at least 8 bottle a day, every day.  Goal is to reduse weight by 10% by end of three months, and will re-evaluate then.  RTC in 4 weeks for Nurse visit to check weight & BP, and get next B12 injections.    Please refer to After Visit Summary for other counseling recommendations.    Ann Howell, CNM   Ann Howell, CNM      Consider the Low Glycemic Index Diet and 6 smaller meals daily .  This boosts your metabolism and regulates your sugars:   Use the protein bar by Atkins because they have lots of fiber in them  Find the low carb flatbreads, tortillas and pita breads for sandwiches:  Joseph's  makes a pita bread and a flat bread , available at [facility] [employer] and [employer]; Toufayah makes a low carb flatbread available at [employer] and [employer] that is 9 net carbs and 100 cal [place] makes a low carb whole wheat tortilla available at [employer],and most grocery stores with 6 net carbs and 210 cal  [place] yogurt can still have a lot of carbs .  Dannon Light N fit has 80 cal and 8 carbs
